# Patient Record
Sex: Female | Born: 1960 | Race: White | Hispanic: No | Marital: Married
Health system: Southern US, Community
[De-identification: ages and names within clinical notes are randomized; demographics above are authoritative.]

---

## 2006-07-20 HISTORY — PX: BREAST BIOPSY: SHX20

## 2008-08-01 ENCOUNTER — Ambulatory Visit: Payer: Self-pay | Admitting: Internal Medicine

## 2009-08-02 ENCOUNTER — Ambulatory Visit: Payer: Self-pay | Admitting: Internal Medicine

## 2011-09-28 ENCOUNTER — Ambulatory Visit: Payer: Self-pay | Admitting: Family Medicine

## 2012-05-19 ENCOUNTER — Ambulatory Visit: Payer: Self-pay | Admitting: Family Medicine

## 2012-09-26 ENCOUNTER — Ambulatory Visit: Payer: Self-pay | Admitting: Gastroenterology

## 2013-05-17 ENCOUNTER — Ambulatory Visit: Payer: Self-pay | Admitting: Internal Medicine

## 2013-05-22 ENCOUNTER — Ambulatory Visit: Payer: Self-pay | Admitting: Family Medicine

## 2013-05-26 ENCOUNTER — Ambulatory Visit: Payer: Self-pay | Admitting: Internal Medicine

## 2013-06-12 ENCOUNTER — Ambulatory Visit: Payer: Self-pay | Admitting: Family Medicine

## 2013-06-19 ENCOUNTER — Ambulatory Visit: Payer: Self-pay | Admitting: Internal Medicine

## 2014-06-06 ENCOUNTER — Ambulatory Visit: Payer: Self-pay | Admitting: Family Medicine

## 2014-06-12 ENCOUNTER — Ambulatory Visit: Payer: Self-pay | Admitting: Family Medicine

## 2014-06-19 ENCOUNTER — Ambulatory Visit: Payer: Self-pay | Admitting: Family Medicine

## 2014-07-20 ENCOUNTER — Ambulatory Visit: Payer: Self-pay | Admitting: Family Medicine

## 2015-08-12 ENCOUNTER — Other Ambulatory Visit: Payer: Self-pay | Admitting: Family Medicine

## 2015-08-12 DIAGNOSIS — Z1231 Encounter for screening mammogram for malignant neoplasm of breast: Secondary | ICD-10-CM

## 2015-08-22 ENCOUNTER — Ambulatory Visit
Admission: RE | Admit: 2015-08-22 | Discharge: 2015-08-22 | Disposition: A | Discharge status: Home / Self Care | Payer: Managed Care, Other (non HMO) | Source: Ambulatory Visit | Attending: Family Medicine | Admitting: Family Medicine

## 2015-08-22 DIAGNOSIS — Z1231 Encounter for screening mammogram for malignant neoplasm of breast: Secondary | ICD-10-CM | POA: Insufficient documentation

## 2016-08-11 ENCOUNTER — Other Ambulatory Visit: Payer: Self-pay | Admitting: Family Medicine

## 2016-08-11 DIAGNOSIS — Z1231 Encounter for screening mammogram for malignant neoplasm of breast: Secondary | ICD-10-CM

## 2016-09-10 ENCOUNTER — Ambulatory Visit
Admission: RE | Admit: 2016-09-10 | Discharge: 2016-09-10 | Disposition: A | Discharge status: Home / Self Care | Payer: Managed Care, Other (non HMO) | Source: Ambulatory Visit | Attending: Family Medicine | Admitting: Family Medicine

## 2016-09-10 DIAGNOSIS — Z1231 Encounter for screening mammogram for malignant neoplasm of breast: Secondary | ICD-10-CM | POA: Diagnosis not present

## 2017-08-12 ENCOUNTER — Other Ambulatory Visit: Payer: Self-pay | Admitting: Family Medicine

## 2017-08-12 DIAGNOSIS — Z1231 Encounter for screening mammogram for malignant neoplasm of breast: Secondary | ICD-10-CM

## 2017-09-13 ENCOUNTER — Ambulatory Visit
Admission: RE | Admit: 2017-09-13 | Discharge: 2017-09-13 | Disposition: A | Discharge status: Home / Self Care | Payer: 59 | Source: Ambulatory Visit | Attending: Family Medicine | Admitting: Family Medicine

## 2017-09-13 DIAGNOSIS — Z1231 Encounter for screening mammogram for malignant neoplasm of breast: Secondary | ICD-10-CM | POA: Insufficient documentation

## 2018-08-23 ENCOUNTER — Other Ambulatory Visit: Payer: Self-pay | Admitting: Family Medicine

## 2018-08-23 DIAGNOSIS — Z1231 Encounter for screening mammogram for malignant neoplasm of breast: Secondary | ICD-10-CM

## 2018-09-19 ENCOUNTER — Ambulatory Visit
Admission: RE | Admit: 2018-09-19 | Discharge: 2018-09-19 | Disposition: A | Discharge status: Home / Self Care | Payer: 59 | Source: Ambulatory Visit | Attending: Family Medicine | Admitting: Family Medicine

## 2018-09-19 DIAGNOSIS — Z1231 Encounter for screening mammogram for malignant neoplasm of breast: Secondary | ICD-10-CM | POA: Diagnosis present

## 2019-02-01 ENCOUNTER — Other Ambulatory Visit
Admission: RE | Admit: 2019-02-01 | Discharge: 2019-02-01 | Disposition: A | Discharge status: Home / Self Care | Payer: 59 | Source: Ambulatory Visit | Attending: Podiatry | Admitting: Podiatry

## 2019-02-01 DIAGNOSIS — M79672 Pain in left foot: Secondary | ICD-10-CM | POA: Insufficient documentation

## 2019-02-01 DIAGNOSIS — M109 Gout, unspecified: Secondary | ICD-10-CM | POA: Diagnosis not present

## 2019-02-01 LAB — SYNOVIAL CELL COUNT + DIFF, W/ CRYSTALS
Crystals, Fluid: NONE SEEN
Eosinophils-Synovial: 19 %
Lymphocytes-Synovial Fld: 81 %
Monocyte-Macrophage-Synovial Fluid: 0 %
Neutrophil, Synovial: 0 %
Other Cells-SYN: 0
WBC, Synovial: 22 /mm3 (ref 0–200)

## 2019-08-25 ENCOUNTER — Other Ambulatory Visit: Payer: Self-pay | Admitting: Family Medicine

## 2019-08-25 DIAGNOSIS — Z1231 Encounter for screening mammogram for malignant neoplasm of breast: Secondary | ICD-10-CM

## 2019-09-25 ENCOUNTER — Ambulatory Visit
Admission: RE | Admit: 2019-09-25 | Discharge: 2019-09-25 | Disposition: A | Discharge status: Home / Self Care | Payer: 59 | Source: Ambulatory Visit | Attending: Family Medicine | Admitting: Family Medicine

## 2019-09-25 DIAGNOSIS — Z1231 Encounter for screening mammogram for malignant neoplasm of breast: Secondary | ICD-10-CM | POA: Diagnosis present

## 2019-09-26 ENCOUNTER — Other Ambulatory Visit: Payer: Self-pay | Admitting: Family Medicine

## 2019-09-26 DIAGNOSIS — R928 Other abnormal and inconclusive findings on diagnostic imaging of breast: Secondary | ICD-10-CM

## 2019-09-26 DIAGNOSIS — N6489 Other specified disorders of breast: Secondary | ICD-10-CM

## 2019-10-10 ENCOUNTER — Encounter: Payer: Self-pay | Admitting: Radiology

## 2019-10-10 ENCOUNTER — Ambulatory Visit
Admission: RE | Admit: 2019-10-10 | Discharge: 2019-10-10 | Disposition: A | Discharge status: Home / Self Care | Payer: 59 | Source: Ambulatory Visit | Attending: Family Medicine | Admitting: Family Medicine

## 2019-10-10 DIAGNOSIS — N6489 Other specified disorders of breast: Secondary | ICD-10-CM | POA: Diagnosis present

## 2019-10-10 DIAGNOSIS — R928 Other abnormal and inconclusive findings on diagnostic imaging of breast: Secondary | ICD-10-CM

## 2019-10-11 ENCOUNTER — Other Ambulatory Visit: Payer: Self-pay | Admitting: Family Medicine

## 2019-10-11 DIAGNOSIS — N6489 Other specified disorders of breast: Secondary | ICD-10-CM

## 2019-10-17 ENCOUNTER — Other Ambulatory Visit: Payer: Self-pay | Admitting: Podiatry

## 2019-10-17 DIAGNOSIS — M899 Disorder of bone, unspecified: Secondary | ICD-10-CM

## 2019-10-17 DIAGNOSIS — M898X9 Other specified disorders of bone, unspecified site: Secondary | ICD-10-CM

## 2019-10-19 ENCOUNTER — Other Ambulatory Visit: Payer: Self-pay

## 2019-10-19 ENCOUNTER — Ambulatory Visit
Admission: RE | Admit: 2019-10-19 | Discharge: 2019-10-19 | Disposition: A | Discharge status: Home / Self Care | Payer: 59 | Source: Ambulatory Visit | Attending: Podiatry | Admitting: Podiatry

## 2019-10-19 DIAGNOSIS — M899 Disorder of bone, unspecified: Secondary | ICD-10-CM | POA: Insufficient documentation

## 2019-10-19 DIAGNOSIS — M898X9 Other specified disorders of bone, unspecified site: Secondary | ICD-10-CM | POA: Insufficient documentation

## 2020-04-16 ENCOUNTER — Ambulatory Visit
Admission: RE | Admit: 2020-04-16 | Discharge: 2020-04-16 | Disposition: A | Discharge status: Home / Self Care | Payer: 59 | Source: Ambulatory Visit | Attending: Family Medicine | Admitting: Family Medicine

## 2020-04-16 ENCOUNTER — Other Ambulatory Visit: Payer: Self-pay

## 2020-04-16 DIAGNOSIS — N6489 Other specified disorders of breast: Secondary | ICD-10-CM | POA: Diagnosis present

## 2020-10-03 ENCOUNTER — Ambulatory Visit (LOCAL_COMMUNITY_HEALTH_CENTER): Payer: No Typology Code available for payment source

## 2020-10-03 ENCOUNTER — Other Ambulatory Visit: Payer: Self-pay

## 2020-10-03 DIAGNOSIS — Z23 Encounter for immunization: Secondary | ICD-10-CM

## 2020-10-03 NOTE — Progress Notes (Signed)
Tolerated Hep A well today. Updated [facility] copy given and explained. Jerel Shepherd, RN

## 2021-06-24 IMAGING — MG DIGITAL SCREENING BILAT W/ TOMO W/ CAD
8 series · 8 of 24 positions shown · non-contrast
Comparison: Previous exam(s).

CLINICAL DATA: Screening.

EXAM:
DIGITAL SCREENING BILATERAL MAMMOGRAM WITH TOMO AND CAD

[R CC synth-2D]
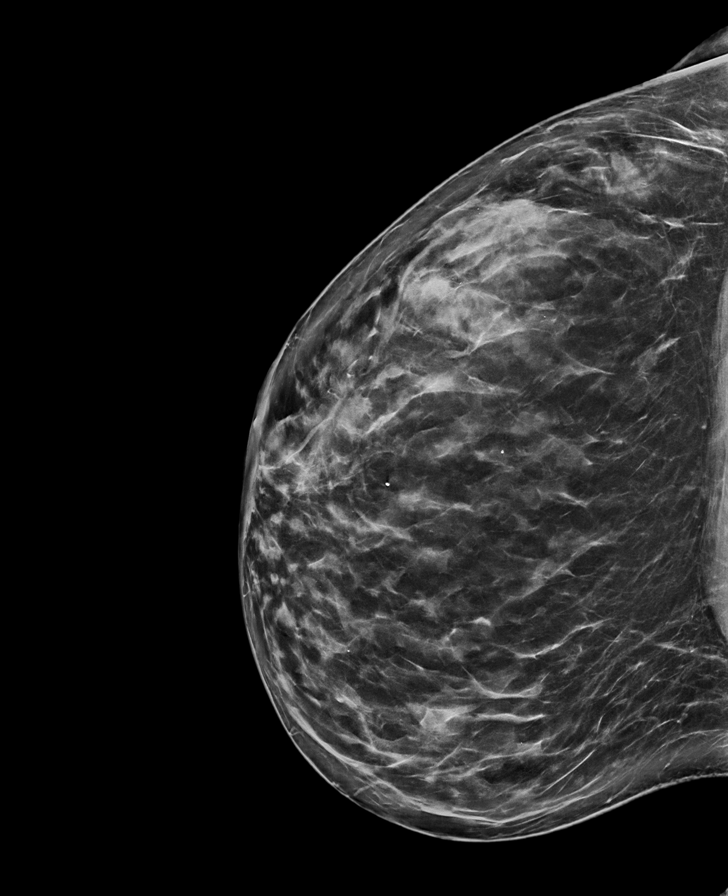

[R MLO synth-2D]
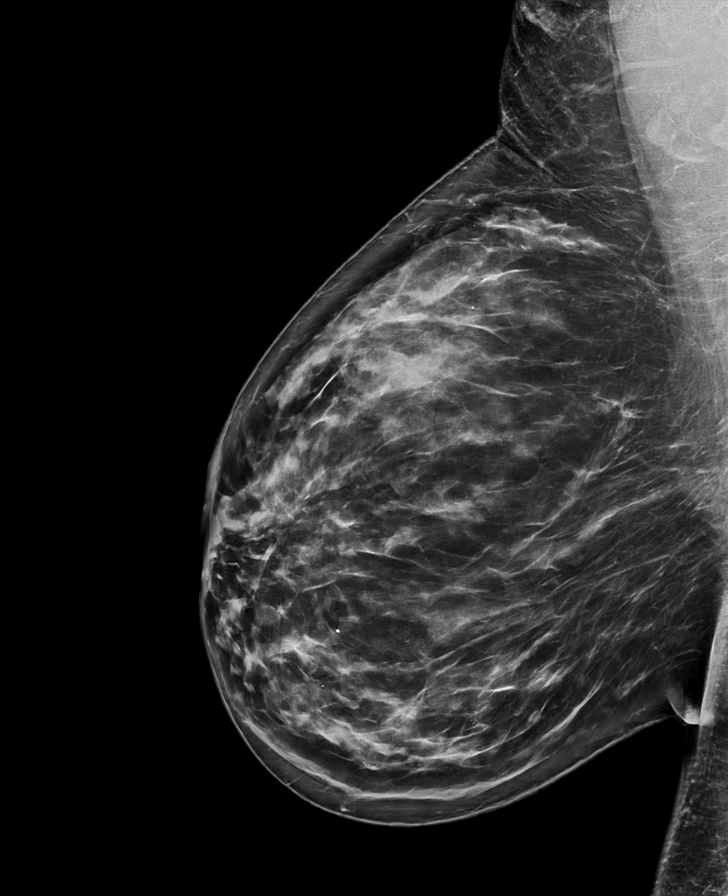

[L MLO synth-2D]
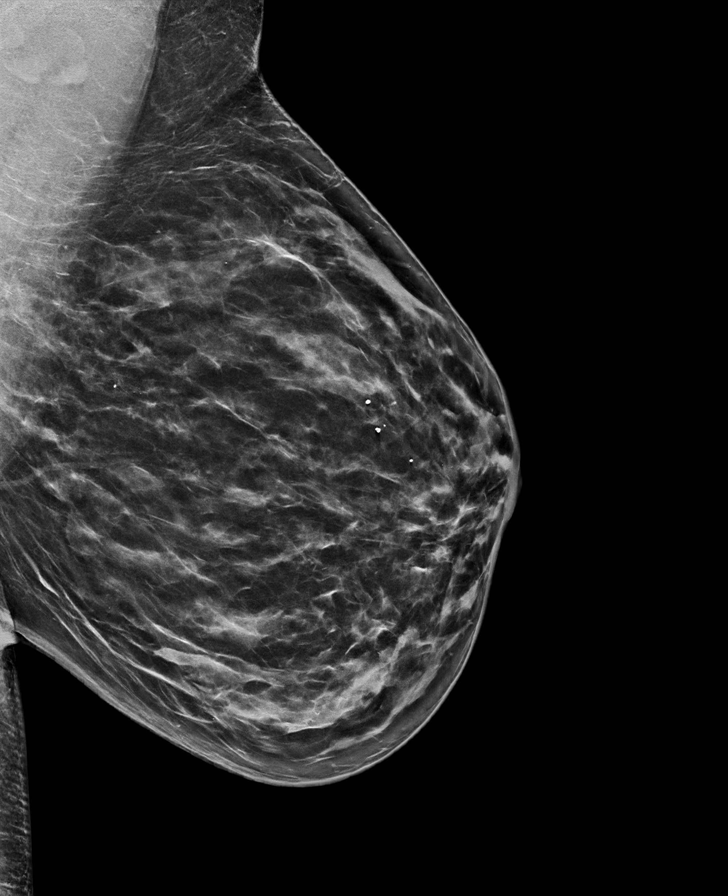

[L CC synth-2D]
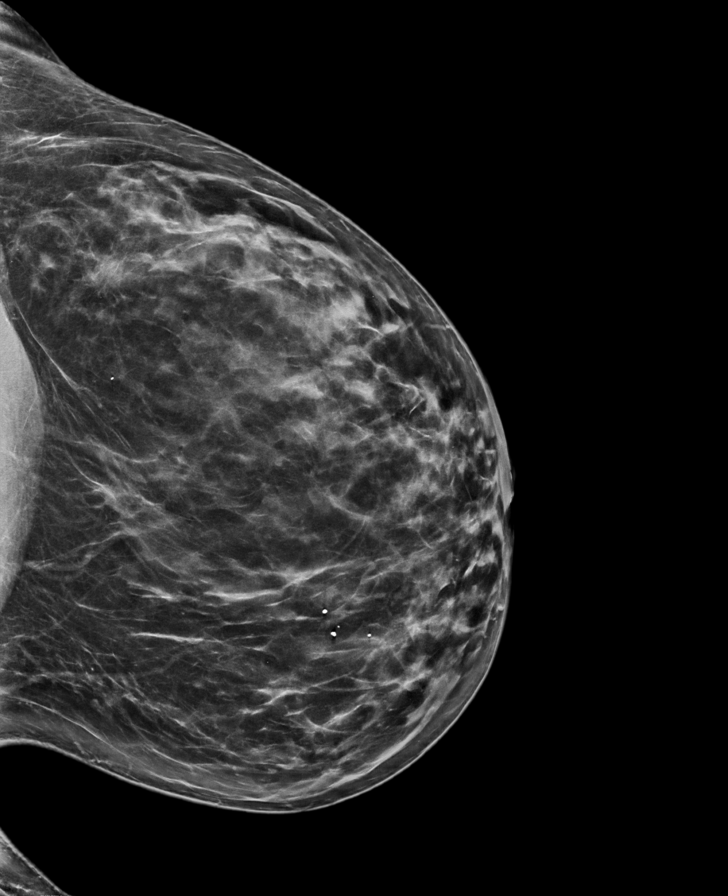

[R CC tomo · tomo slice 44/87.0]
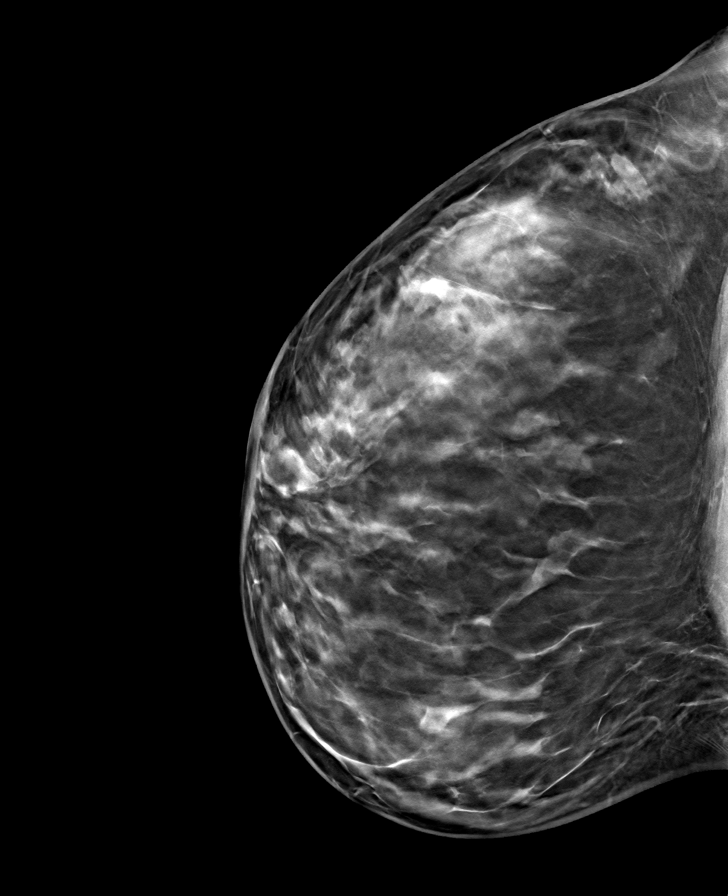

[R MLO tomo · tomo slice 45/90.0]
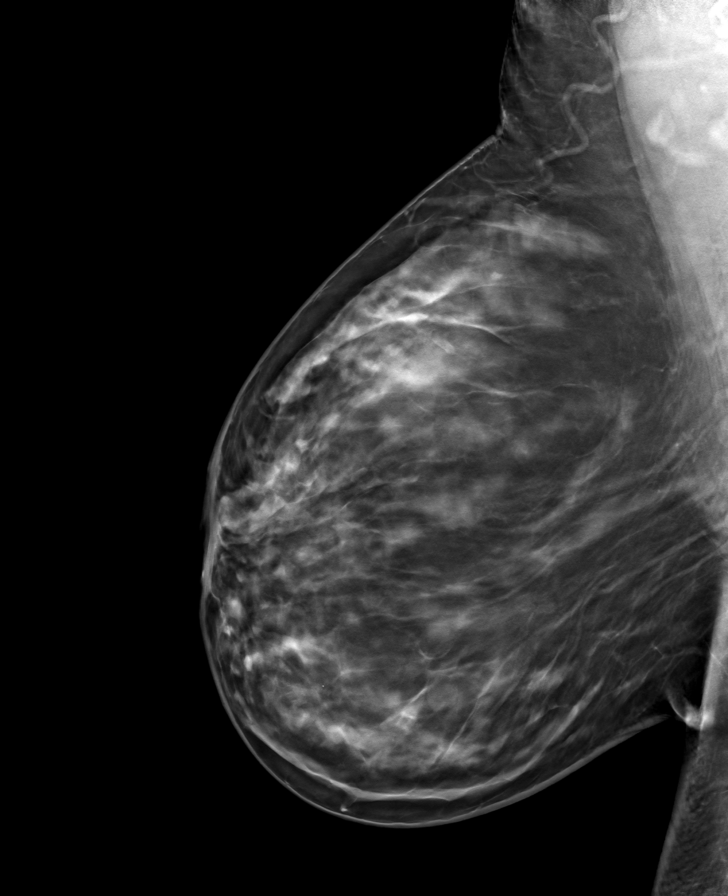

[L MLO tomo · tomo slice 39/76.0]
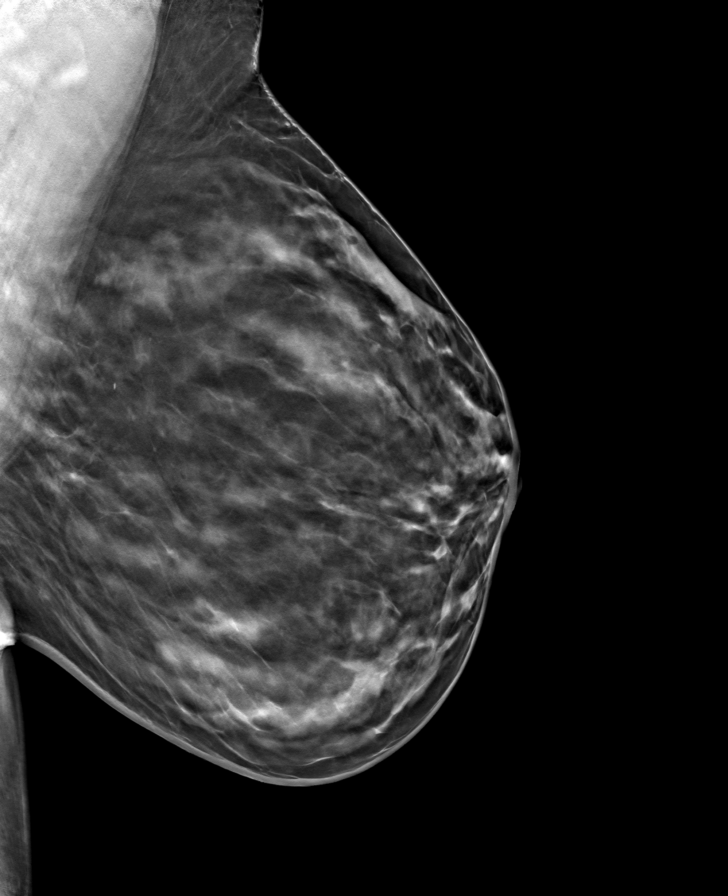

[L CC tomo · tomo slice 37/73.0]
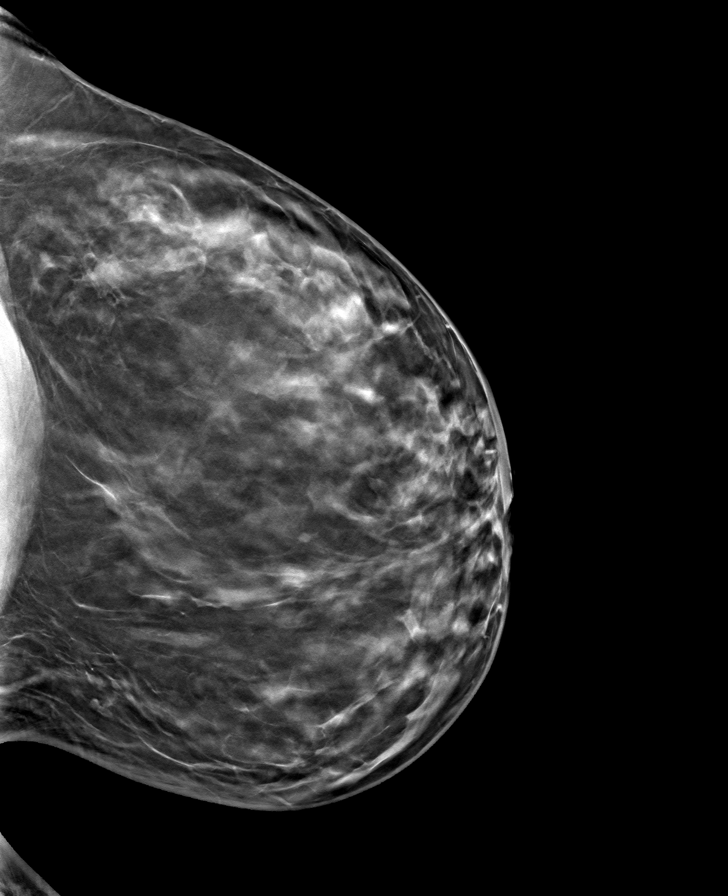

[8 of 24 positions shown; findings below may reference images not displayed]

ACR Breast Density Category c: The breast tissue is heterogeneously
dense, which may obscure small masses.
FINDINGS: In the left breast, a possible asymmetry warrants further
evaluation. In the right breast, no findings suspicious for
malignancy. Images were processed with CAD.
IMPRESSION: Further evaluation is suggested for possible asymmetry in the left
breast.

RECOMMENDATION:
Diagnostic mammogram and possibly ultrasound of the left breast.
(Code:[id number])

The patient will be contacted regarding the findings, and additional
imaging will be scheduled.

BI-RADS CATEGORY  0: Incomplete. Need additional imaging evaluation
and/or prior mammograms for comparison.

## 2021-08-18 ENCOUNTER — Other Ambulatory Visit: Payer: Self-pay | Admitting: Family Medicine

## 2021-08-18 DIAGNOSIS — Z1231 Encounter for screening mammogram for malignant neoplasm of breast: Secondary | ICD-10-CM

## 2021-09-29 ENCOUNTER — Ambulatory Visit
Admission: RE | Admit: 2021-09-29 | Discharge: 2021-09-29 | Disposition: A | Discharge status: Home / Self Care | Payer: No Typology Code available for payment source | Source: Ambulatory Visit | Attending: Family Medicine | Admitting: Family Medicine

## 2021-09-29 ENCOUNTER — Other Ambulatory Visit: Payer: Self-pay | Admitting: Family Medicine

## 2021-09-29 ENCOUNTER — Other Ambulatory Visit: Payer: Self-pay

## 2021-09-29 DIAGNOSIS — N6459 Other signs and symptoms in breast: Secondary | ICD-10-CM

## 2021-09-29 DIAGNOSIS — Z1231 Encounter for screening mammogram for malignant neoplasm of breast: Secondary | ICD-10-CM | POA: Insufficient documentation

## 2021-10-15 ENCOUNTER — Other Ambulatory Visit: Payer: Self-pay

## 2021-10-15 ENCOUNTER — Ambulatory Visit
Admission: RE | Admit: 2021-10-15 | Discharge: 2021-10-15 | Disposition: A | Discharge status: Home / Self Care | Payer: No Typology Code available for payment source | Source: Ambulatory Visit | Attending: Family Medicine | Admitting: Family Medicine

## 2021-10-15 DIAGNOSIS — N6459 Other signs and symptoms in breast: Secondary | ICD-10-CM

## 2021-12-29 ENCOUNTER — Ambulatory Visit (INDEPENDENT_AMBULATORY_CARE_PROVIDER_SITE_OTHER): Payer: No Typology Code available for payment source | Admitting: Dermatology

## 2021-12-29 ENCOUNTER — Encounter: Payer: Self-pay | Admitting: Dermatology

## 2021-12-29 DIAGNOSIS — L409 Psoriasis, unspecified: Secondary | ICD-10-CM

## 2021-12-29 DIAGNOSIS — L4050 Arthropathic psoriasis, unspecified: Secondary | ICD-10-CM | POA: Diagnosis not present

## 2021-12-29 MED ORDER — MOMETASONE FUROATE 0.1 % EX CREA: TOPICAL_CREAM | CUTANEOUS | 0 refills | Status: AC

## 2021-12-29 NOTE — Progress Notes (Signed)
   New Patient Visit  Subjective  Brandy Wells is a 61 y.o. female who presents for the following: Rash (Has been on MTX x 2 mths by rheumatologist who recommended a biopsy to r/o PsA. Rash was on elbows, inside of the ears, and nail dystrophy. Rash has never been particularly bothersome to patient but joint involvement has been. Pt did try OTC HC cream occasionally for flares).  The following portions of the chart were reviewed this encounter and updated as appropriate:   Allergies  Meds  Problems  Med Hx  Surg Hx  Fam Hx     Review of Systems:  No other skin or systemic complaints except as noted in HPI or Assessment and Plan.  Objective  Well appearing patient in no apparent distress; mood and affect are within normal limits.  A focused examination was performed including the ears, face, extremities. Relevant physical exam findings are noted in the Assessment and Plan.  B/L elbows Scale of the elbows. No nail changes today.    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 It is associated with an increased risk of inflammatory bowel disease, heart disease, non-alcoholic fatty liver disease, and depression.    Continue care with [facility] Rheumatology, MTX, and folic acid as prescribed.   Start Mometasone 0.1% cream to aa's QD PRN up to 5 days a week.  Topical steroids (such as triamcinolone, fluocinolone, fluocinonide, mometasone, clobetasol, halobetasol, betamethasone, hydrocortisone) can cause thinning and lightening of the skin if they are used for too long in the same area. Your  physician has selected the right strength medicine for your problem and area affected on the body. Please use your medication only as directed by your physician to prevent side effects.   mometasone (ELOCON) 0.1 % cream - B/L elbows Apply to aa's psoriasis QD 5d/wk M-F  Return in about 3 months (around 03/31/2022) for psoriasis follow up .  Maylene Roes, CMA, am acting as scribe for Armida Sans, MD . Documentation: I have reviewed the above documentation for accuracy and completeness, and I agree with the above.  Armida Sans, MD

## 2021-12-29 NOTE — Patient Instructions (Signed)
Due to recent changes in healthcare laws, you may see results of your pathology and/or laboratory studies on MyChart before the doctors have had a chance to review them. We understand that in some cases there may be results that are confusing or concerning to you. Please understand that not all results are received at the same time and often the doctors may need to interpret multiple results in order to provide you with the best plan of care or course of treatment. Therefore, we ask that you please give us 2 business days to thoroughly review all your results before contacting the office for clarification. Should we see a critical lab result, you will be contacted sooner.   If You Need Anything After Your Visit  If you have any questions or concerns for your doctor, please call our main line at [phone] and press option 4 to reach your doctor's medical assistant. If no one answers, please leave a voicemail as directed and we will return your call as soon as possible. Messages left after 4 pm will be answered the following business day.   You may also send us a message via MyChart. We typically respond to MyChart messages within 1-2 business days.  For prescription refills, please ask your pharmacy to contact our office. Our fax number is [phone].  If you have an urgent issue when the clinic is closed that cannot wait until the next business day, you can page your doctor at the number below.    Please note that while we do our best to be available for urgent issues outside of office hours, we are not available 24/7.   If you have an urgent issue and are unable to reach us, you may choose to seek medical care at your doctor's office, retail clinic, urgent care center, or emergency room.  If you have a medical emergency, please immediately call 911 or go to the emergency department.  Pager Numbers  - Dr. Kowalski: [phone]  - Dr. Moye: [phone]  - Dr. Stewart:  [phone]  In the event of inclement weather, please call our main line at [phone] for an update on the status of any delays or closures.  Dermatology Medication Tips: Please keep the boxes that topical medications come in in order to help keep track of the instructions about where and how to use these. Pharmacies typically print the medication instructions only on the boxes and not directly on the medication tubes.   If your medication is too expensive, please contact our office at [phone] option 4 or send us a message through MyChart.   We are unable to tell what your co-pay for medications will be in advance as this is different depending on your insurance coverage. However, we may be able to find a substitute medication at lower cost or fill out paperwork to get insurance to cover a needed medication.   If a prior authorization is required to get your medication covered by your insurance company, please allow us 1-2 business days to complete this process.  Drug prices often vary depending on where the prescription is filled and some pharmacies may offer cheaper prices.  The website www.goodrx.com contains coupons for medications through different pharmacies. The prices here do not account for what the cost may be with help from insurance (it may be cheaper with your insurance), but the website can give you the price if you did not use any insurance.  - You can print the associated coupon and take it with   your prescription to the pharmacy.  - You may also stop by our office during regular business hours and pick up a GoodRx coupon card.  - If you need your prescription sent electronically to a different pharmacy, notify our office through [facility] MyChart or by phone at [phone] option 4.     Si Usted Necesita Algo Despus de Su Visita  Tambin puede enviarnos un mensaje a travs de MyChart. Por lo general respondemos a los mensajes de MyChart en el transcurso de 1 a 2  das hbiles.  Para renovar recetas, por favor pida a su farmacia que se ponga en contacto con nuestra oficina. Nuestro nmero de fax es el [phone].  Si tiene un asunto urgente cuando la clnica est cerrada y que no puede esperar hasta el siguiente da hbil, puede llamar/localizar a su doctor(a) al nmero que aparece a continuacin.   Por favor, tenga en cuenta que aunque hacemos todo lo posible para estar disponibles para asuntos urgentes fuera del horario de oficina, no estamos disponibles las 24 horas del da, los 7 das de la semana.   Si tiene un problema urgente y no puede comunicarse con nosotros, puede optar por buscar atencin mdica  en el consultorio de su doctor(a), en una clnica privada, en un centro de atencin urgente o en una sala de emergencias.  Si tiene una emergencia mdica, por favor llame inmediatamente al 911 o vaya a la sala de emergencias.  Nmeros de bper  - Dr. Kowalski: [phone]  - Dra. Moye: [phone]  - Dra. Stewart: [phone]  En caso de inclemencias del tiempo, por favor llame a nuestra lnea principal al [phone] para una actualizacin sobre el estado de cualquier retraso o cierre.  Consejos para la medicacin en dermatologa: Por favor, guarde las cajas en las que vienen los medicamentos de uso tpico para ayudarle a seguir las instrucciones sobre dnde y cmo usarlos. Las farmacias generalmente imprimen las instrucciones del medicamento slo en las cajas y no directamente en los tubos del medicamento.   Si su medicamento es muy caro, por favor, pngase en contacto con nuestra oficina llamando al [phone] y presione la opcin 4 o envenos un mensaje a travs de MyChart.   No podemos decirle cul ser su copago por los medicamentos por adelantado ya que esto es diferente dependiendo de la cobertura de su seguro. Sin embargo, es posible que podamos encontrar un medicamento sustituto a menor costo o llenar un formulario para que el  seguro cubra el medicamento que se considera necesario.   Si se requiere una autorizacin previa para que su compaa de seguros cubra su medicamento, por favor permtanos de 1 a 2 das hbiles para completar este proceso.  Los precios de los medicamentos varan con frecuencia dependiendo del lugar de dnde se surte la receta y alguna farmacias pueden ofrecer precios ms baratos.  El sitio web www.goodrx.com tiene cupones para medicamentos de diferentes farmacias. Los precios aqu no tienen en cuenta lo que podra costar con la ayuda del seguro (puede ser ms barato con su seguro), pero el sitio web puede darle el precio si no utiliz ningn seguro.  - Puede imprimir el cupn correspondiente y llevarlo con su receta a la farmacia.  - Tambin puede pasar por nuestra oficina durante el horario de atencin regular y recoger una tarjeta de cupones de GoodRx.  - Si necesita que su receta se enve electrnicamente a una farmacia diferente, informe a nuestra oficina a travs de MyChart de [place]   o por telfono llamando al [phone] y presione la opcin 4.  

## 2022-04-01 ENCOUNTER — Ambulatory Visit: Payer: BC Managed Care – PPO | Admitting: Dermatology

## 2022-04-01 DIAGNOSIS — L4050 Arthropathic psoriasis, unspecified: Secondary | ICD-10-CM

## 2022-04-01 DIAGNOSIS — L409 Psoriasis, unspecified: Secondary | ICD-10-CM

## 2022-04-01 MED ORDER — VTAMA 1 % EX CREA: 1.0000 "application " | TOPICAL_CREAM | Freq: Every day | CUTANEOUS | 3 refills | Status: DC

## 2022-04-01 NOTE — Patient Instructions (Signed)
Your prescription was sent to [facility] in [place]. A representative from [facility] will contact you within 3 business hours to verify your address and insurance information to schedule a free delivery. If for any reason you do not receive a phone call from them, please reach out to them. Their phone number is [phone] and their hours are Monday-Friday 9:00 am-5:00 pm.      Due to recent changes in healthcare laws, you may see results of your pathology and/or laboratory studies on MyChart before the doctors have had a chance to review them. We understand that in some cases there may be results that are confusing or concerning to you. Please understand that not all results are received at the same time and often the doctors may need to interpret multiple results in order to provide you with the best plan of care or course of treatment. Therefore, we ask that you please give us 2 business days to thoroughly review all your results before contacting the office for clarification. Should we see a critical lab result, you will be contacted sooner.   If You Need Anything After Your Visit  If you have any questions or concerns for your doctor, please call our main line at [phone] and press option 4 to reach your doctor's medical assistant. If no one answers, please leave a voicemail as directed and we will return your call as soon as possible. Messages left after 4 pm will be answered the following business day.   You may also send us a message via MyChart. We typically respond to MyChart messages within 1-2 business days.  For prescription refills, please ask your pharmacy to contact our office. Our fax number is [phone].  If you have an urgent issue when the clinic is closed that cannot wait until the next business day, you can page your doctor at the number below.    Please note that while we do our best to be available for urgent issues outside of office hours, we are not  available 24/7.   If you have an urgent issue and are unable to reach us, you may choose to seek medical care at your doctor's office, retail clinic, urgent care center, or emergency room.  If you have a medical emergency, please immediately call 911 or go to the emergency department.  Pager Numbers  - Dr. Kowalski: [phone]  - Dr. Moye: [phone]  - Dr. Stewart: [phone]  In the event of inclement weather, please call our main line at [phone] for an update on the status of any delays or closures.  Dermatology Medication Tips: Please keep the boxes that topical medications come in in order to help keep track of the instructions about where and how to use these. Pharmacies typically print the medication instructions only on the boxes and not directly on the medication tubes.   If your medication is too expensive, please contact our office at [phone] option 4 or send us a message through MyChart.   We are unable to tell what your co-pay for medications will be in advance as this is different depending on your insurance coverage. However, we may be able to find a substitute medication at lower cost or fill out paperwork to get insurance to cover a needed medication.   If a prior authorization is required to get your medication covered by your insurance company, please allow us 1-2 business days to complete this process.  Drug prices often vary depending on where the prescription is   filled and some pharmacies may offer cheaper prices.  The website www.goodrx.com contains coupons for medications through different pharmacies. The prices here do not account for what the cost may be with help from insurance (it may be cheaper with your insurance), but the website can give you the price if you did not use any insurance.  - You can print the associated coupon and take it with your prescription to the pharmacy.  - You may also stop by our office during regular business hours  and pick up a GoodRx coupon card.  - If you need your prescription sent electronically to a different pharmacy, notify our office through [facility] MyChart or by phone at [phone] option 4.     Si Usted Necesita Algo Despus de Su Visita  Tambin puede enviarnos un mensaje a travs de MyChart. Por lo general respondemos a los mensajes de MyChart en el transcurso de 1 a 2 das hbiles.  Para renovar recetas, por favor pida a su farmacia que se ponga en contacto con nuestra oficina. Nuestro nmero de fax es el [phone].  Si tiene un asunto urgente cuando la clnica est cerrada y que no puede esperar hasta el siguiente da hbil, puede llamar/localizar a su doctor(a) al nmero que aparece a continuacin.   Por favor, tenga en cuenta que aunque hacemos todo lo posible para estar disponibles para asuntos urgentes fuera del horario de oficina, no estamos disponibles las 24 horas del da, los 7 das de la semana.   Si tiene un problema urgente y no puede comunicarse con nosotros, puede optar por buscar atencin mdica  en el consultorio de su doctor(a), en una clnica privada, en un centro de atencin urgente o en una sala de emergencias.  Si tiene una emergencia mdica, por favor llame inmediatamente al 911 o vaya a la sala de emergencias.  Nmeros de bper  - Dr. Kowalski: [phone]  - Dra. Moye: [phone]  - Dra. Stewart: [phone]  En caso de inclemencias del tiempo, por favor llame a nuestra lnea principal al [phone] para una actualizacin sobre el estado de cualquier retraso o cierre.  Consejos para la medicacin en dermatologa: Por favor, guarde las cajas en las que vienen los medicamentos de uso tpico para ayudarle a seguir las instrucciones sobre dnde y cmo usarlos. Las farmacias generalmente imprimen las instrucciones del medicamento slo en las cajas y no directamente en los tubos del medicamento.   Si su medicamento es muy caro, por favor, pngase  en contacto con nuestra oficina llamando al [phone] y presione la opcin 4 o envenos un mensaje a travs de MyChart.   No podemos decirle cul ser su copago por los medicamentos por adelantado ya que esto es diferente dependiendo de la cobertura de su seguro. Sin embargo, es posible que podamos encontrar un medicamento sustituto a menor costo o llenar un formulario para que el seguro cubra el medicamento que se considera necesario.   Si se requiere una autorizacin previa para que su compaa de seguros cubra su medicamento, por favor permtanos de 1 a 2 das hbiles para completar este proceso.  Los precios de los medicamentos varan con frecuencia dependiendo del lugar de dnde se surte la receta y alguna farmacias pueden ofrecer precios ms baratos.  El sitio web www.goodrx.com tiene cupones para medicamentos de diferentes farmacias. Los precios aqu no tienen en cuenta lo que podra costar con la ayuda del seguro (puede ser ms barato con su seguro), pero el sitio web puede darle   el precio si no utiliz ningn seguro.  - Puede imprimir el cupn correspondiente y llevarlo con su receta a la farmacia.  - Tambin puede pasar por nuestra oficina durante el horario de atencin regular y recoger una tarjeta de cupones de GoodRx.  - Si necesita que su receta se enve electrnicamente a una farmacia diferente, informe a nuestra oficina a travs de MyChart de [place] o por telfono llamando al [phone] y presione la opcin 4.  

## 2022-04-01 NOTE — Progress Notes (Signed)
   Follow-Up Visit   Subjective  Brandy Wells is a 61 y.o. female who presents for the following: Psoriasis (2 month follow up - elbows - Mometasone cream has worked very well. She is follow by Dr Angie Fava for Psoriatic arthritis and is being treated with MTX and Folic acid).  The following portions of the chart were reviewed this encounter and updated as appropriate:   Allergies  Meds  Problems  Med Hx  Surg Hx  Fam Hx     Review of Systems:  No other skin or systemic complaints except as noted in HPI or Assessment and Plan.  Objective  Well appearing patient in no apparent distress; mood and affect are within normal limits.  A focused examination was performed including arms. Relevant physical exam findings are noted in the Assessment and Plan.  Arms Mild dryness of elbows   Assessment & Plan  Psoriasis Arms  - with Psoriatic Arthritis /PsA, being followed by Defoor, Lonia Farber, PA   Psoriasis is a chronic non-curable, but treatable genetic/hereditary disease that may have other systemic features affecting other organ systems such as joints (Psoriatic Arthritis). It is associated with an increased risk of inflammatory bowel disease, heart disease, non-alcoholic fatty liver disease, and depression.     Continue care with [facility] Rheumatology, MTX, and folic acid as prescribed.   Skin psoriasis is better controlled.  Start Vtama cream daily.  May continue Mometasone cream qd prn flares.  Tapinarof (VTAMA) 1 % CREA - Arms Apply 1 application  topically daily. Related Medications mometasone (ELOCON) 0.1 % cream Apply to aa's psoriasis QD 5d/wk M-F  Return in about 6 months (around 09/30/2022) for Psoriasis.  I, Brandy Wells, CMA, am acting as scribe for Brandy Sans, MD . Documentation: I have reviewed the above documentation for accuracy and completeness, and I agree with the above.  Brandy Sans, MD

## 2022-04-05 ENCOUNTER — Encounter: Payer: Self-pay | Admitting: Dermatology

## 2022-09-02 ENCOUNTER — Other Ambulatory Visit: Payer: Self-pay | Admitting: Family Medicine

## 2022-09-02 DIAGNOSIS — Z1231 Encounter for screening mammogram for malignant neoplasm of breast: Secondary | ICD-10-CM

## 2022-09-30 ENCOUNTER — Ambulatory Visit: Payer: BC Managed Care – PPO | Admitting: Dermatology

## 2022-09-30 VITALS — BP 118/69 | HR 86

## 2022-09-30 DIAGNOSIS — L409 Psoriasis, unspecified: Secondary | ICD-10-CM | POA: Diagnosis not present

## 2022-09-30 DIAGNOSIS — Z79899 Other long term (current) drug therapy: Secondary | ICD-10-CM

## 2022-09-30 MED ORDER — VTAMA 1 % EX CREA: 1.0000 "application " | TOPICAL_CREAM | Freq: Every day | CUTANEOUS | 11 refills | Status: AC

## 2022-09-30 NOTE — Patient Instructions (Addendum)
Your prescription was sent to [facility] in [place]. A representative from [facility] will contact you within 3 business hours to verify your address and insurance information to schedule a free delivery. If for any reason you do not receive a phone call from them, please reach out to them. Their phone number is [phone] and their hours are Monday-Friday 9:00 am-5:00 pm.      Due to recent changes in healthcare laws, you may see results of your pathology and/or laboratory studies on MyChart before the doctors have had a chance to review them. We understand that in some cases there may be results that are confusing or concerning to you. Please understand that not all results are received at the same time and often the doctors may need to interpret multiple results in order to provide you with the best plan of care or course of treatment. Therefore, we ask that you please give us 2 business days to thoroughly review all your results before contacting the office for clarification. Should we see a critical lab result, you will be contacted sooner.   If You Need Anything After Your Visit  If you have any questions or concerns for your doctor, please call our main line at [phone] and press option 4 to reach your doctor's medical assistant. If no one answers, please leave a voicemail as directed and we will return your call as soon as possible. Messages left after 4 pm will be answered the following business day.   You may also send us a message via MyChart. We typically respond to MyChart messages within 1-2 business days.  For prescription refills, please ask your pharmacy to contact our office. Our fax number is [phone].  If you have an urgent issue when the clinic is closed that cannot wait until the next business day, you can page your doctor at the number below.    Please note that while we do our best to be available for urgent issues outside of office hours, we are not  available 24/7.   If you have an urgent issue and are unable to reach us, you may choose to seek medical care at your doctor's office, retail clinic, urgent care center, or emergency room.  If you have a medical emergency, please immediately call 911 or go to the emergency department.  Pager Numbers  - Dr. Kowalski: [phone]  - Dr. Moye: [phone]  - Dr. Stewart: [phone]  In the event of inclement weather, please call our main line at [phone] for an update on the status of any delays or closures.  Dermatology Medication Tips: Please keep the boxes that topical medications come in in order to help keep track of the instructions about where and how to use these. Pharmacies typically print the medication instructions only on the boxes and not directly on the medication tubes.   If your medication is too expensive, please contact our office at [phone] option 4 or send us a message through MyChart.   We are unable to tell what your co-pay for medications will be in advance as this is different depending on your insurance coverage. However, we may be able to find a substitute medication at lower cost or fill out paperwork to get insurance to cover a needed medication.   If a prior authorization is required to get your medication covered by your insurance company, please allow us 1-2 business days to complete this process.  Drug prices often vary depending on where the prescription is   filled and some pharmacies may offer cheaper prices.  The website www.goodrx.com contains coupons for medications through different pharmacies. The prices here do not account for what the cost may be with help from insurance (it may be cheaper with your insurance), but the website can give you the price if you did not use any insurance.  - You can print the associated coupon and take it with your prescription to the pharmacy.  - You may also stop by our office during regular business hours  and pick up a GoodRx coupon card.  - If you need your prescription sent electronically to a different pharmacy, notify our office through [facility] MyChart or by phone at [phone] option 4.     Si Usted Necesita Algo Despus de Su Visita  Tambin puede enviarnos un mensaje a travs de MyChart. Por lo general respondemos a los mensajes de MyChart en el transcurso de 1 a 2 das hbiles.  Para renovar recetas, por favor pida a su farmacia que se ponga en contacto con nuestra oficina. Nuestro nmero de fax es el [phone].  Si tiene un asunto urgente cuando la clnica est cerrada y que no puede esperar hasta el siguiente da hbil, puede llamar/localizar a su doctor(a) al nmero que aparece a continuacin.   Por favor, tenga en cuenta que aunque hacemos todo lo posible para estar disponibles para asuntos urgentes fuera del horario de oficina, no estamos disponibles las 24 horas del da, los 7 das de la semana.   Si tiene un problema urgente y no puede comunicarse con nosotros, puede optar por buscar atencin mdica  en el consultorio de su doctor(a), en una clnica privada, en un centro de atencin urgente o en una sala de emergencias.  Si tiene una emergencia mdica, por favor llame inmediatamente al 911 o vaya a la sala de emergencias.  Nmeros de bper  - Dr. Kowalski: [phone]  - Dra. Moye: [phone]  - Dra. Stewart: [phone]  En caso de inclemencias del tiempo, por favor llame a nuestra lnea principal al [phone] para una actualizacin sobre el estado de cualquier retraso o cierre.  Consejos para la medicacin en dermatologa: Por favor, guarde las cajas en las que vienen los medicamentos de uso tpico para ayudarle a seguir las instrucciones sobre dnde y cmo usarlos. Las farmacias generalmente imprimen las instrucciones del medicamento slo en las cajas y no directamente en los tubos del medicamento.   Si su medicamento es muy caro, por favor, pngase  en contacto con nuestra oficina llamando al [phone] y presione la opcin 4 o envenos un mensaje a travs de MyChart.   No podemos decirle cul ser su copago por los medicamentos por adelantado ya que esto es diferente dependiendo de la cobertura de su seguro. Sin embargo, es posible que podamos encontrar un medicamento sustituto a menor costo o llenar un formulario para que el seguro cubra el medicamento que se considera necesario.   Si se requiere una autorizacin previa para que su compaa de seguros cubra su medicamento, por favor permtanos de 1 a 2 das hbiles para completar este proceso.  Los precios de los medicamentos varan con frecuencia dependiendo del lugar de dnde se surte la receta y alguna farmacias pueden ofrecer precios ms baratos.  El sitio web www.goodrx.com tiene cupones para medicamentos de diferentes farmacias. Los precios aqu no tienen en cuenta lo que podra costar con la ayuda del seguro (puede ser ms barato con su seguro), pero el sitio web puede darle   el precio si no utiliz ningn seguro.  - Puede imprimir el cupn correspondiente y llevarlo con su receta a la farmacia.  - Tambin puede pasar por nuestra oficina durante el horario de atencin regular y recoger una tarjeta de cupones de GoodRx.  - Si necesita que su receta se enve electrnicamente a una farmacia diferente, informe a nuestra oficina a travs de MyChart de [place] o por telfono llamando al [phone] y presione la opcin 4.  

## 2022-09-30 NOTE — Progress Notes (Signed)
   Follow-Up Visit   Subjective  Brandy Wells is a 62 y.o. female who presents for the following: Psoriasis (Elbows, 37m f/u, Vtama cr prn flares, Mtx as prescribed by Rheumatology, pt feels like skin flares happened when she had joint flares and since on MTX joints been good).  The following portions of the chart were reviewed this encounter and updated as appropriate:   Allergies  Meds  Problems  Med Hx  Surg Hx  Fam Hx     Review of Systems:  No other skin or systemic complaints except as noted in HPI or Assessment and Plan.  Objective  Well appearing patient in no apparent distress; mood and affect are within normal limits.  A focused examination was performed including arms. Relevant physical exam findings are noted in the Assessment and Plan.  elbows 0.5cm scaly patch L elbow, right elbow clear   Assessment & Plan  Psoriasis elbows  with Psoriatic Arthritis /PsA, being followed by Starr Sinclair Defoor, PA   Counseling on psoriasis and coordination of care  psoriasis is a chronic non-curable, but treatable genetic/hereditary disease that may have other systemic features affecting other organ systems such as joints (Psoriatic Arthritis). It is associated with an increased risk of inflammatory bowel disease, heart disease, non-alcoholic fatty liver disease, and depression.  Treatments include light and laser treatments; topical medications; and systemic medications including oral and injectables.   Cont Vtama cr qd prn flares Cont Mtx as prescribed by rheumatologist  Related Medications mometasone (ELOCON) 0.1 % cream Apply to aa's psoriasis QD 5d/wk M-F Tapinarof (VTAMA) 1 % CREA Apply 1 application  topically daily. Qd to aa psoriasis  Return in about 1 year (around 09/30/2023) for TBSE, Psoriasis f/u.  I, Othelia Pulling, RMA, am acting as scribe for Sarina Ser, MD . Documentation: I have reviewed the above documentation for accuracy and completeness, and I  agree with the above.  Sarina Ser, MD

## 2022-10-02 ENCOUNTER — Encounter: Payer: Self-pay | Admitting: Dermatology

## 2022-10-19 ENCOUNTER — Ambulatory Visit
Admission: RE | Admit: 2022-10-19 | Discharge: 2022-10-19 | Disposition: A | Discharge status: Home / Self Care | Payer: BC Managed Care – PPO | Source: Ambulatory Visit | Attending: Family Medicine | Admitting: Family Medicine

## 2022-10-19 DIAGNOSIS — Z1231 Encounter for screening mammogram for malignant neoplasm of breast: Secondary | ICD-10-CM | POA: Diagnosis present

## 2023-08-10 ENCOUNTER — Other Ambulatory Visit: Payer: Self-pay | Admitting: Obstetrics and Gynecology

## 2023-08-10 DIAGNOSIS — Z1231 Encounter for screening mammogram for malignant neoplasm of breast: Secondary | ICD-10-CM

## 2023-09-30 ENCOUNTER — Ambulatory Visit: Payer: BC Managed Care – PPO | Admitting: Dermatology

## 2023-10-04 ENCOUNTER — Encounter: Payer: Self-pay | Admitting: Dermatology

## 2023-10-04 ENCOUNTER — Ambulatory Visit: Admitting: Dermatology

## 2023-10-04 DIAGNOSIS — L814 Other melanin hyperpigmentation: Secondary | ICD-10-CM

## 2023-10-04 DIAGNOSIS — L650 Telogen effluvium: Secondary | ICD-10-CM

## 2023-10-04 DIAGNOSIS — Z79899 Other long term (current) drug therapy: Secondary | ICD-10-CM

## 2023-10-04 DIAGNOSIS — W908XXA Exposure to other nonionizing radiation, initial encounter: Secondary | ICD-10-CM

## 2023-10-04 DIAGNOSIS — Z1283 Encounter for screening for malignant neoplasm of skin: Secondary | ICD-10-CM

## 2023-10-04 DIAGNOSIS — L578 Other skin changes due to chronic exposure to nonionizing radiation: Secondary | ICD-10-CM | POA: Diagnosis not present

## 2023-10-04 DIAGNOSIS — Z7189 Other specified counseling: Secondary | ICD-10-CM

## 2023-10-04 DIAGNOSIS — L649 Androgenic alopecia, unspecified: Secondary | ICD-10-CM

## 2023-10-04 DIAGNOSIS — D1801 Hemangioma of skin and subcutaneous tissue: Secondary | ICD-10-CM

## 2023-10-04 DIAGNOSIS — L821 Other seborrheic keratosis: Secondary | ICD-10-CM

## 2023-10-04 DIAGNOSIS — L409 Psoriasis, unspecified: Secondary | ICD-10-CM

## 2023-10-04 DIAGNOSIS — L4050 Arthropathic psoriasis, unspecified: Secondary | ICD-10-CM

## 2023-10-04 DIAGNOSIS — D229 Melanocytic nevi, unspecified: Secondary | ICD-10-CM

## 2023-10-04 MED ORDER — MINOXIDIL 2.5 MG PO TABS
ORAL_TABLET | ORAL | 2 refills | Status: DC
Start: 2023-10-04 — End: 2024-02-29

## 2023-10-04 NOTE — Progress Notes (Signed)
 Follow-Up Visit   Subjective  Brandy Wells is a 63 y.o. female who presents for the following: Skin Cancer Screening and Full Body Skin Exam, psoriasis on her elbows treating with Vtama cream prn and methotrexate prescribed by her rheumatologist. Patient c/o hair thinning.   The patient presents for Total-Body Skin Exam (TBSE) for skin cancer screening and mole check. The patient has spots, moles and lesions to be evaluated, some may be new or changing and the patient may have concern these could be cancer.  The following portions of the chart were reviewed this encounter and updated as appropriate: medications, allergies, medical history  Review of Systems:  No other skin or systemic complaints except as noted in HPI or Assessment and Plan.  Objective  Well appearing patient in no apparent distress; mood and affect are within normal limits.  A full examination was performed including scalp, head, eyes, ears, nose, lips, neck, chest, axillae, abdomen, back, buttocks, bilateral upper extremities, bilateral lower extremities, hands, feet, fingers, toes, fingernails, and toenails. All findings within normal limits unless otherwise noted below.   Relevant physical exam findings are noted in the Assessment and Plan.        Assessment & Plan   SKIN CANCER SCREENING PERFORMED TODAY.  ACTINIC DAMAGE - Chronic condition, secondary to cumulative UV/sun exposure - diffuse scaly erythematous macules with underlying dyspigmentation - Recommend daily broad spectrum sunscreen SPF 30+ to sun-exposed areas, reapply every 2 hours as needed.  - Staying in the shade or wearing long sleeves, sun glasses (UVA+UVB protection) and wide brim hats (4-inch brim around the entire circumference of the hat) are also recommended for sun protection.  - Call for new or changing lesions.  LENTIGINES, SEBORRHEIC KERATOSES, HEMANGIOMAS - Benign normal skin lesions - Benign-appearing - Call for any  changes  MELANOCYTIC NEVI - Tan-brown and/or pink-flesh-colored symmetric macules and papules - Benign appearing on exam today - Observation - Call clinic for new or changing moles - Recommend daily use of broad spectrum spf 30+ sunscreen to sun-exposed areas.   Psoriasis elbows  with Psoriatic Arthritis /PsA, (PsA treated with Methotrexate) being followed by Lonia Farber Defoor, PA = Rheumatology  Skin psoriasis 0% BSA on topical Vtama. Counseling on psoriasis and coordination of care  psoriasis is a chronic non-curable, but treatable genetic/hereditary disease that may have other systemic features affecting other organ systems such as joints (Psoriatic Arthritis). It is associated with an increased risk of inflammatory bowel disease, heart disease, non-alcoholic fatty liver disease, and depression.  Treatments include light and laser treatments; topical medications; and systemic medications including oral and injectables.   Cont Vtama cr qd prn flares Cont methotrexate prescribed by Lonia Farber Defoor, PA Discussed with patient she should discuss with [name] whether an injectable biologic might have less potential long-term side effects, but similar or better results for her psoriatic arthritis.  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 Condition is symptomatic/ bothersome to patient. Not currently at goal.   Female Androgenic Alopecia is a chronic condition related to genetics and/or hormonal changes.  In women androgenetic alopecia is commonly associated with menopause but may occur any time after puberty.  It causes hair thinning primarily on the crown with widening of the part and temporal hairline recession.  Can  use OTC Rogaine  (minoxidil) 5% solution/foam as directed.  Oral treatments in female patients who have no contraindication may include : - Low dose oral minoxidil 1.25 - 5mg  daily - Spironolactone 50 - 100mg  bid - Finasteride 2.5 - 5 mg daily Adjunctive therapies include: - Low Level Laser Light Therapy (LLLT) - Platelet-rich plasma injections (PRP) - Hair Transplants or scalp reduction   Treatment Plan: Start Minoxidil 2.5 mg take 1/2 tablet daily   No hx of heart problems, No hx of ankle edema   Doses of oral minoxidil for hair loss are considered 'low dose'. This is because the doses used for hair loss are much lower than the doses which are used for conditions such as high blood pressure (hypertension). The doses used for hypertension are 10-40mg  per day.  Side effects are uncommon at the low doses (up to 2.5 mg/day) used to treat hair loss. Potential side effects, more commonly seen at higher doses, include: Increase in hair growth (hypertrichosis) elsewhere on face and body Temporary hair shedding upon starting medication which may last up to 4 weeks Ankle swelling, fluid retention, rapid weight gain more than 5 pounds Low blood pressure and feeling lightheaded or dizzy when standing up quickly Fast or irregular heartbeat Headaches   Long term medication management.  Patient is using long term (months to years) prescription medication  to control their dermatologic condition.  These medications require periodic monitoring to evaluate for efficacy and side effects and may require periodic laboratory monitoring.     Return in about 6 months (around 04/05/2024) for alopecia and TBSE in 12 months .  IAngelique Holm, CMA, am acting as scribe for Armida Sans, MD .   Documentation: I have reviewed the above documentation for accuracy and completeness, and I agree with the above.  Armida Sans, MD

## 2023-10-04 NOTE — Patient Instructions (Signed)

## 2023-11-15 ENCOUNTER — Ambulatory Visit: Admit: 2023-11-15 | Payer: BC Managed Care – PPO | Admitting: Internal Medicine

## 2023-11-15 ENCOUNTER — Ambulatory Visit: Payer: Self-pay

## 2023-11-15 DIAGNOSIS — K259 Gastric ulcer, unspecified as acute or chronic, without hemorrhage or perforation: Secondary | ICD-10-CM | POA: Diagnosis not present

## 2023-11-15 DIAGNOSIS — K640 First degree hemorrhoids: Secondary | ICD-10-CM | POA: Diagnosis not present

## 2023-11-15 DIAGNOSIS — K6289 Other specified diseases of anus and rectum: Secondary | ICD-10-CM | POA: Diagnosis not present

## 2023-11-15 DIAGNOSIS — K2289 Other specified disease of esophagus: Secondary | ICD-10-CM | POA: Diagnosis not present

## 2023-11-15 DIAGNOSIS — K2100 Gastro-esophageal reflux disease with esophagitis, without bleeding: Secondary | ICD-10-CM | POA: Diagnosis not present

## 2023-11-15 DIAGNOSIS — Z860101 Personal history of adenomatous and serrated colon polyps: Secondary | ICD-10-CM | POA: Diagnosis not present

## 2023-11-15 DIAGNOSIS — K449 Diaphragmatic hernia without obstruction or gangrene: Secondary | ICD-10-CM | POA: Diagnosis not present

## 2023-11-15 DIAGNOSIS — Z09 Encounter for follow-up examination after completed treatment for conditions other than malignant neoplasm: Secondary | ICD-10-CM | POA: Diagnosis present

## 2023-11-15 SURGERY — COLONOSCOPY WITH PROPOFOL
Anesthesia: General

## 2023-11-18 ENCOUNTER — Ambulatory Visit
Admission: RE | Admit: 2023-11-18 | Discharge: 2023-11-18 | Disposition: A | Discharge status: Home / Self Care | Source: Ambulatory Visit | Attending: Obstetrics and Gynecology | Admitting: Obstetrics and Gynecology

## 2023-11-18 DIAGNOSIS — Z1231 Encounter for screening mammogram for malignant neoplasm of breast: Secondary | ICD-10-CM | POA: Diagnosis present

## 2023-12-10 ENCOUNTER — Ambulatory Visit: Payer: Self-pay | Admitting: Obstetrics and Gynecology

## 2024-02-29 ENCOUNTER — Other Ambulatory Visit: Payer: Self-pay | Admitting: Dermatology

## 2024-04-11 ENCOUNTER — Encounter: Payer: Self-pay | Admitting: Dermatology

## 2024-04-11 ENCOUNTER — Ambulatory Visit: Admitting: Dermatology

## 2024-04-11 DIAGNOSIS — Z79899 Other long term (current) drug therapy: Secondary | ICD-10-CM | POA: Diagnosis not present

## 2024-04-11 DIAGNOSIS — Z7189 Other specified counseling: Secondary | ICD-10-CM

## 2024-04-11 DIAGNOSIS — L649 Androgenic alopecia, unspecified: Secondary | ICD-10-CM

## 2024-04-11 DIAGNOSIS — L650 Telogen effluvium: Secondary | ICD-10-CM

## 2024-04-11 MED ORDER — MINOXIDIL 2.5 MG PO TABS: 1.2500 mg | ORAL_TABLET | Freq: Every day | ORAL | 6 refills | Status: DC

## 2024-04-11 NOTE — Progress Notes (Signed)
   Follow-Up Visit   Subjective  Brandy Wells is a 63 y.o. female who presents for the following: 6 months f/u on Alopecia on her scalp, taking Minoxidil  2.5 mg 1/2 tablet daily with a good response, no sides from Minoxidil  tablet    The following portions of the chart were reviewed this encounter and updated as appropriate: medications, allergies, medical history  Review of Systems:  No other skin or systemic complaints except as noted in HPI or Assessment and Plan.  Objective  Well appearing patient in no apparent distress; mood and affect are within normal limits.    A focused examination was performed of the following areas: Scalp   Relevant exam findings are noted in the Assessment and Plan.           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 Condition is symptomatic/ bothersome to patient. Not currently at goal.    Female Androgenic Alopecia is a chronic condition related to genetics and/or hormonal changes.  In women androgenetic alopecia is commonly associated with menopause but may occur any time after puberty.  It causes hair thinning primarily on the crown with widening of the part and temporal hairline recession.  Can use OTC Rogaine  (minoxidil ) 5% solution/foam as directed.  Oral treatments in female patients who have no contraindication may include : - Low dose oral minoxidil  1.25 - 5mg  daily - Spironolactone 50 - 100mg  bid - Finasteride 2.5 - 5 mg daily Adjunctive therapies include: - Low Level Laser Light Therapy (LLLT) - Platelet-rich plasma injections (PRP) - Hair Transplants or scalp reduction    Treatment Plan: Continue Minoxidil  2.5 mg take 1/2 tablet daily     No hx of heart problems, No hx of ankle edema    Doses of oral minoxidil  for hair loss are considered 'low dose'. This is because the doses used for hair loss are much lower than the doses which are used for conditions such as high blood pressure (hypertension). The doses used for hypertension are 10-40mg  per day.  Side effects are uncommon at the low doses (up to 2.5 mg/day) used to treat hair loss. Potential side effects, more commonly seen at higher doses, include: Increase in hair growth (hypertrichosis) elsewhere on face and body Temporary hair shedding upon starting medication which may last up to 4 weeks Ankle swelling, fluid retention, rapid weight gain more than 5 pounds Low blood pressure and feeling lightheaded or dizzy when standing up quickly Fast or irregular heartbeat Headaches    Long term medication management.  Patient is using long term (months to years) prescription medication  to control their dermatologic condition.  These medications require periodic monitoring to evaluate for efficacy and side effects and may require periodic laboratory monitoring.      Return in about 6 months (around 10/09/2024) for scheduled appt 10/04/24.  IFay Kirks, CMA, am acting as scribe for Alm Rhyme, MD .   Documentation: I have reviewed the above documentation for accuracy and completeness, and I agree with the above.  Alm Rhyme, MD

## 2024-04-11 NOTE — Patient Instructions (Signed)

## 2024-05-16 ENCOUNTER — Other Ambulatory Visit: Payer: Self-pay

## 2024-05-16 MED ORDER — MINOXIDIL 2.5 MG PO TABS: 1.2500 mg | ORAL_TABLET | Freq: Every day | ORAL | 1 refills | Status: AC

## 2024-10-04 ENCOUNTER — Ambulatory Visit: Admitting: Dermatology
